# Patient Record
Sex: Male | Born: 1975
Health system: Southern US, Community
[De-identification: ages and names within clinical notes are randomized; demographics above are authoritative.]

## PROBLEM LIST (undated history)

## (undated) DIAGNOSIS — F419 Anxiety disorder, unspecified: Secondary | ICD-10-CM

## (undated) HISTORY — PX: NO PAST SURGERIES: SHX2092

---

## 2013-02-23 ENCOUNTER — Emergency Department
Admission: EM | Admit: 2013-02-23 | Discharge: 2013-02-23 | Disposition: A | Discharge status: Home / Self Care | Payer: BC Managed Care – PPO | Source: Home / Self Care | Attending: Family Medicine | Admitting: Family Medicine

## 2013-02-23 ENCOUNTER — Encounter: Payer: Self-pay | Admitting: *Deleted

## 2013-02-23 DIAGNOSIS — T6391XA Toxic effect of contact with unspecified venomous animal, accidental (unintentional), initial encounter: Secondary | ICD-10-CM

## 2013-02-23 DIAGNOSIS — Z23 Encounter for immunization: Secondary | ICD-10-CM

## 2013-02-23 HISTORY — DX: Anxiety disorder, unspecified: F41.9

## 2013-02-23 MED ORDER — TRIAMCINOLONE ACETONIDE 40 MG/ML IJ SUSP
40.0000 mg | Freq: Once | INTRAMUSCULAR | Status: AC
  Administered 2013-02-23: 40 mg via INTRAMUSCULAR

## 2013-02-23 MED ORDER — TETANUS-DIPHTH-ACELL PERTUSSIS 5-2.5-18.5 LF-MCG/0.5 IM SUSP
0.5000 mL | Freq: Once | INTRAMUSCULAR | Status: AC
  Administered 2013-02-23: 0.5 mL via INTRAMUSCULAR

## 2013-02-23 NOTE — ED Notes (Addendum)
Thomas Moses was stung by a bee @ 1230 today on his right hand. He has pulled the stinger out, has edema and redness to right hand. C/o itching. Ice applied. Denies SOB, throat or neck swelling.

## 2013-02-23 NOTE — ED Provider Notes (Signed)
CSN: [id number]     Arrival date & time 02/23/13  1524 History     First MD Initiated Contact with Patient 02/23/13 1600     Chief Complaint  Patient presents with  . Insect Bite    Sting     HPI Comments: Patient was stung on a finger of his right hand about 3 hours ago with resultant swelling, erythema, and warmth of the dorsum of his right hand.  No difficulty swallowing or shortness of breath.  He does not remember his last Tdap.  Patient is a 37 y.o. male presenting with trauma. The history is provided by the patient.  Trauma Mechanism of injury: yellow jacket sting Injury location: finger of right hand. Incident location: home Time since incident: 3 hours   Current symptoms:      Pain quality: aching      Pain timing: constant      Associated symptoms:            Denies chest pain, difficulty breathing, headache, nausea and vomiting.   Relevant PMH:      Tetanus status: out of date   Past Medical History  Diagnosis Date  . Anxiety    History reviewed. No pertinent past surgical history. Family History  Problem Relation Age of Onset  . Hypertension Father    History  Substance Use Topics  . Smoking status: Never Smoker   . Smokeless tobacco: Never Used  . Alcohol Use: Yes    Review of Systems  Cardiovascular: Negative for chest pain.  Gastrointestinal: Negative for nausea and vomiting.  Neurological: Negative for headaches.  All other systems reviewed and are negative.    Allergies  Review of patient's allergies indicates no known allergies.  Home Medications   Current Outpatient Rx  Name  Route  Sig  Dispense  Refill  . PARoxetine (PAXIL) 10 MG tablet   Oral   Take 10 mg by mouth every morning.          BP 121/79  Pulse 62  Temp(Src) 97.9 F (36.6 C) (Oral)  Resp 14  Ht 5\' 10"  (1.778 m)  Wt 173 lb (78.472 kg)  BMI 24.82 kg/m2  SpO2 100% Physical Exam  Nursing note and vitals reviewed. Constitutional: He is oriented to person, place,  and time. He appears well-developed and well-nourished. No distress.  HENT:  Head: Atraumatic.  Mouth/Throat: Oropharynx is clear and moist.  Eyes: Conjunctivae are normal. Pupils are equal, round, and reactive to light.  Neck: Neck supple.  Cardiovascular: Normal heart sounds.   Pulmonary/Chest: Breath sounds normal.  Musculoskeletal: He exhibits edema. He exhibits no tenderness.       Right hand: He exhibits decreased range of motion, decreased capillary refill and swelling. He exhibits no tenderness, no bony tenderness and no deformity. Normal sensation noted. Decreased strength noted. He exhibits finger abduction.       Hands: There is diffuse swelling, warmth, and mild erythema of the dorsum of the right hand but no tenderness.  Distal neurovascular function is intact.   Lymphadenopathy:    He has no cervical adenopathy.  Neurological: He is alert and oriented to person, place, and time.  Skin: Skin is warm and dry. He is not diaphoretic. There is erythema.    ED Course   Procedures      1. Sting from hornet, wasp, or bee, initial encounter     MDM  Tdap administered.  Kenalog 40mg  IM Continue applying ice pack several times daily until  swelling decreases.  Elevate.  Take antihistamine such as Benadryl, Zyrtec, Claritin etc. Return for signs of infection: persistent redness, warmth, pain, fever, etc.  Lattie Haw, MD 02/23/13 [phone]

## 2013-02-24 ENCOUNTER — Telehealth: Payer: Self-pay | Admitting: *Deleted

## 2013-12-21 ENCOUNTER — Emergency Department (INDEPENDENT_AMBULATORY_CARE_PROVIDER_SITE_OTHER): Payer: BC Managed Care – PPO

## 2013-12-21 ENCOUNTER — Encounter: Payer: Self-pay | Admitting: Emergency Medicine

## 2013-12-21 ENCOUNTER — Emergency Department
Admission: EM | Admit: 2013-12-21 | Discharge: 2013-12-21 | Disposition: A | Discharge status: Home / Self Care | Payer: BC Managed Care – PPO | Source: Home / Self Care | Attending: Emergency Medicine | Admitting: Emergency Medicine

## 2013-12-21 DIAGNOSIS — S93601A Unspecified sprain of right foot, initial encounter: Secondary | ICD-10-CM

## 2013-12-21 DIAGNOSIS — M79609 Pain in unspecified limb: Secondary | ICD-10-CM

## 2013-12-21 DIAGNOSIS — S93609A Unspecified sprain of unspecified foot, initial encounter: Secondary | ICD-10-CM

## 2013-12-21 NOTE — ED Provider Notes (Signed)
CSN: [id number]     Arrival date & time 12/21/13  0815 History   First MD Initiated Contact with Patient 12/21/13 [phone]     Chief Complaint  Patient presents with  . Foot Injury   (Consider location/radiation/quality/duration/timing/severity/associated sxs/prior Treatment) HPI Rt foot injury last night, twisted it while playing with daughter. Pain right lateral foot is sharp, severe, worse with movement. He cannot weight-bear right lower extremity . No paresthesias or weakness. He tried taking Aleve and that helped the pain somewhat Denies prior foot or ankle problems. Past Medical History  Diagnosis Date  . Anxiety    History reviewed. No pertinent past surgical history. Family History  Problem Relation Age of Onset  . Hypertension Father    History  Substance Use Topics  . Smoking status: Never Smoker   . Smokeless tobacco: Never Used  . Alcohol Use: Yes    Review of Systems  All other systems reviewed and are negative.   Allergies  Review of patient's allergies indicates not on file.  Home Medications   Prior to Admission medications   Medication Sig Start Date End Date Taking? Authorizing Provider  naproxen sodium (ANAPROX) 220 MG tablet Take 220 mg by mouth 2 (two) times daily with a meal.   Yes Historical Provider, MD  PARoxetine (PAXIL) 10 MG tablet Take 10 mg by mouth every morning.    Historical Provider, MD   BP 130/81  Pulse 60  Temp(Src) 97.9 F (36.6 C) (Oral)  Ht 5' 10"  (1.778 m)  Wt 180 lb (81.647 kg)  BMI 25.83 kg/m2  SpO2 99% Physical Exam  Nursing note and vitals reviewed. Constitutional: He is oriented to person, place, and time. He appears well-developed and well-nourished. No distress.  HENT:  Head: Normocephalic and atraumatic.  Eyes: Conjunctivae and EOM are normal. Pupils are equal, round, and reactive to light. No scleral icterus.  Neck: Normal range of motion.  Cardiovascular: Normal rate.   Pulmonary/Chest: Effort normal.   Abdominal: He exhibits no distension.  Neurological: He is alert and oriented to person, place, and time.  Skin: Skin is warm.  Psychiatric: He has a normal mood and affect.   In general, he is very uncomfortable from right foot pain and cannot weight-bear on it. Right ankle: Nontender without deformity. Range of motion normal. Right foot: Swollen, ecchymotic, very tender right lateral mid and proximal foot. Nontender over fifth metatarsal. Range of motion limited because of pain. Extensor tendons tested, intact. Neurovascular distally intact .  ED Course  Procedures (including critical care time) Labs Review Labs Reviewed - No data to display  Imaging Review Dg Foot Complete Right  12/21/2013   CLINICAL DATA:  Lateral right foot pain status post injury  EXAM: RIGHT FOOT COMPLETE - 3+ VIEW  COMPARISON:  None.  FINDINGS: The bones of the foot are adequately mineralized. There is no acute fracture nor dislocation. Specific attention to the third through fifth metatarsals reveals no acute abnormality. The soft tissues of the foot are normal.  IMPRESSION: There is no acute bony abnormality of the right foot.   Electronically Signed   By: David  Martinique   On: 12/21/2013 09:11     MDM   1. Sprain of right foot    X-ray right foot negative. No acute bony abnormalities.  Treatment options discussed, as well as risks, benefits, alternatives. Patient voiced understanding and agreement with the following plans:  Rice Cam Walker Crutches Followup with Ortho if no better one week He declined prescription  pain medication, he prefers to use Aleve as that has helped somewhat. Red flags discussed  Jacqulyn Cane, MD 12/21/13 1009

## 2013-12-21 NOTE — ED Notes (Signed)
Rt foot injury last night, twisted it while playing with daughter.

## 2014-05-10 ENCOUNTER — Ambulatory Visit: Payer: BC Managed Care – PPO | Admitting: Family

## 2014-11-18 ENCOUNTER — Encounter: Payer: Self-pay | Admitting: Emergency Medicine

## 2014-11-18 ENCOUNTER — Emergency Department
Admission: EM | Admit: 2014-11-18 | Discharge: 2014-11-18 | Disposition: A | Discharge status: Home / Self Care | Payer: Managed Care, Other (non HMO) | Source: Home / Self Care | Attending: Family Medicine | Admitting: Family Medicine

## 2014-11-18 DIAGNOSIS — F411 Generalized anxiety disorder: Secondary | ICD-10-CM | POA: Diagnosis not present

## 2014-11-18 MED ORDER — LORAZEPAM 0.5 MG PO TABS: ORAL_TABLET | ORAL | Status: DC

## 2014-11-18 NOTE — ED Notes (Signed)
Patient has chronic anxiety issues.

## 2014-11-18 NOTE — ED Provider Notes (Signed)
CSN: [id number]     Arrival date & time 11/18/14  1620 History   First MD Initiated Contact with Patient 11/18/14 1721     Chief Complaint  Patient presents with  . Anxiety      HPI Comments: Patient has a long history of anxiety and panic attacks which had been controlled during the past 7 to 8 years with Paxil.  About 3 months ago his provider switched him to Lexapro 88m daily.  He had a good response initially, but during the past several days he has become more anxious and "panicky."  During the past week he has had early morning awakening.  He does not feel down or depressed.  He has had no adverse effects from Lexapro. He has a follow-up with his PCP in five days.  Patient is a 39y.o. male presenting with anxiety. The history is provided by the patient.  Anxiety This is a chronic problem. Episode onset: worse for 3 to 4 days. The problem has been gradually worsening. Associated symptoms include shortness of breath. Pertinent negatives include no chest pain and no headaches. The symptoms are aggravated by stress. Nothing relieves the symptoms.    Past Medical History  Diagnosis Date  . Anxiety    History reviewed. No pertinent past surgical history. Family History  Problem Relation Age of Onset  . Hypertension Father    History  Substance Use Topics  . Smoking status: Never Smoker   . Smokeless tobacco: Never Used  . Alcohol Use: Yes    Review of Systems  Constitutional: Positive for fatigue.  Respiratory: Positive for shortness of breath.   Cardiovascular: Negative for chest pain.  Neurological: Negative for headaches.  Psychiatric/Behavioral: Positive for sleep disturbance, decreased concentration and agitation. Negative for suicidal ideas. The patient is nervous/anxious.   All other systems reviewed and are negative.   Allergies  Review of patient's allergies indicates not on file.  Home Medications   Prior to Admission medications   Medication Sig Start Date  End Date Taking? Authorizing Provider  LORazepam (ATIVAN) 0.5 MG tablet Take one tab by mouth, one to three times daily as needed for anxiety 11/18/14   SKandra Nicolas MD  naproxen sodium (ANAPROX) 220 MG tablet Take 220 mg by mouth 2 (two) times daily with a meal.    Historical Provider, MD  PARoxetine (PAXIL) 10 MG tablet Take 10 mg by mouth every morning.    Historical Provider, MD   Pulse 70  Temp(Src) 98.4 F (36.9 C) (Oral)  Resp 16  Ht 5' 9.5" (1.765 m)  Wt 167 lb (75.751 kg)  BMI 24.32 kg/m2  SpO2 100% Physical Exam  Constitutional: He is oriented to person, place, and time. He appears well-developed and well-nourished. No distress.  HENT:  Head: Normocephalic.  Eyes: Conjunctivae are normal. Pupils are equal, round, and reactive to light.  Cardiovascular: Normal heart sounds.   Pulmonary/Chest: Breath sounds normal.  Neurological: He is alert and oriented to person, place, and time.  Psychiatric: He has a normal mood and affect. His behavior is normal. Judgment and thought content normal.  Nursing note and vitals reviewed.   ED Course  Procedures  none   MDM   1. Generalized anxiety disorder, decreased control     Increase escitalopram (Lexapro) 138mto TWO tabs once daily at bedtime. Rx for lorazepam 0.87m587m#20, no refill) once daily to TID prn Followup with Family Doctor in about 5 days.    SteKandra NicolasD  11/25/14 1053 

## 2014-11-18 NOTE — Discharge Instructions (Signed)
Increase escitalopram (Lexapro) 69m to TWO tabs once daily at bedtime.

## 2015-09-23 MED FILL — ESCITALOPRAM 20 MG TABLET: 20 | 90 days supply | Qty: 180 | Fill #1

## 2016-01-13 MED FILL — LORazepam 0.5 MG TABS: 0.5 | 15 days supply | Qty: 30 | Fill #0

## 2016-06-02 MED FILL — ESCITALOPRAM 20 MG TABLET: 20 | 30 days supply | Qty: 30 | Fill #0

## 2016-08-04 MED FILL — ESCITALOPRAM 20 MG TABLET: 20 | 30 days supply | Qty: 30 | Fill #0

## 2016-09-15 MED FILL — ESCITALOPRAM 20 MG TABLET: 20 | 30 days supply | Qty: 30 | Fill #1

## 2016-09-26 ENCOUNTER — Ambulatory Visit (INDEPENDENT_AMBULATORY_CARE_PROVIDER_SITE_OTHER): Payer: Self-pay | Admitting: Nurse Practitioner

## 2016-09-26 ENCOUNTER — Encounter: Payer: Self-pay | Admitting: Nurse Practitioner

## 2016-09-26 VITALS — BP 100/80 | HR 58 | Temp 97.5°F | Ht 70.0 in | Wt 178.8 lb

## 2016-09-26 DIAGNOSIS — J302 Other seasonal allergic rhinitis: Secondary | ICD-10-CM

## 2016-09-26 DIAGNOSIS — Z0000 Encounter for general adult medical examination without abnormal findings: Secondary | ICD-10-CM

## 2016-09-26 MED ORDER — MONTELUKAST SODIUM 10 MG PO TABS: 10.0000 mg | ORAL_TABLET | Freq: Every day | ORAL | 3 refills | Status: DC

## 2016-09-26 MED ORDER — FLUTICASONE PROPIONATE 50 MCG/ACT NA SUSP: 2.0000 | Freq: Every day | NASAL | 2 refills | Status: DC

## 2016-09-26 NOTE — Progress Notes (Signed)
Subjective:  Thomas Moses Reason is a 41 y.o. male who presents for basic physical exam for work. Patient denies any current health related concerns.   The patient denies any history of heart, lung, liver, kidney disease, abnormal bleeding disorders, HTN, seizures or DM.   Immunization History  Administered Date(s) Administered  . Tdap 02/23/2013    Past Medical History:  Diagnosis Date  . Anxiety     No past surgical history on file.  Social History  Substance Use Topics  . Smoking status: Never Smoker  . Smokeless tobacco: Never Used  . Alcohol use Yes    No Known Allergies  Current Outpatient Prescriptions  Medication Sig Dispense Refill  . escitalopram (LEXAPRO) 20 MG tablet Take 20 mg by mouth daily.    Marland Kitchen LORazepam (ATIVAN) 0.5 MG tablet Take one tab by mouth, one to three times daily as needed for anxiety (Patient not taking: Reported on 09/26/2016) 20 tablet 0  . naproxen sodium (ANAPROX) 220 MG tablet Take 220 mg by mouth 2 (two) times daily with a meal.    . PARoxetine (PAXIL) 10 MG tablet Take 10 mg by mouth every morning.     No current facility-administered medications for this visit.     Review of Systems  Constitutional: Negative.   HENT: Positive for congestion. Negative for ear discharge, ear pain and sore throat.        + PND  Eyes: Negative.   Respiratory: Cough: productive, clear sputum.   Gastrointestinal: Negative.   Genitourinary: Negative.   Musculoskeletal: Negative.   Skin: Negative.   Neurological: Negative.   Psychiatric/Behavioral: Negative.       Objective:  BP 100/80   Pulse (!) 58   Temp 97.5 F (36.4 C)   Ht 5' 10"  (1.778 m)   Wt 178 lb 12.8 oz (81.1 kg)   SpO2 98%   BMI 25.66 kg/m   General Appearance:  Alert, cooperative, no distress, appears stated age  Head:  Normocephalic, without obvious abnormality, atraumatic  Eyes:  PERRL, conjunctiva/corneas clear, EOM's intact, fundi benign, both eyes  Ears:  Normal TM's and external  ear canals, both ears  Nose: Nares normal, septum midline, mucosa normal, no drainage or sinus tenderness  Throat: Lips, mucosa, and tongue normal; teeth and gums normal  Neck: Supple, symmetrical, trachea midline, no adenopathy, thyroid: not enlarged, symmetric, no tenderness/mass/nodules, no carotid bruit or JVD  Back:   Symmetric, no curvature, ROM normal, no CVA tenderness  Lungs:   Clear to auscultation bilaterally, respirations unlabored  Chest Wall:  No tenderness or deformity  Heart:  Regular rate and rhythm, S1, S2 normal, no murmur, rub or gallop  Abdomen:   Soft, non-tender, bowel sounds active all four quadrants,  no masses, no organomegaly        Extremities: Extremities normal, atraumatic, no cyanosis or edema  Pulses: 2+ and symmetric  Skin: Skin color, texture, turgor normal, no rashes or lesions  Lymph nodes: Cervical, supraclavicular, and axillary nodes normal  Neurologic: Normal        Assessment:  basic physical exam    Plan:  Patient education provided.  No labs needed at this time.  There were no abnormal findings in this annual physical exam. Patient will follow up as needed with PCP or in this clinic.

## 2016-09-26 NOTE — Patient Instructions (Addendum)
Allergic Rhinitis Allergic rhinitis is when the mucous membranes in the nose respond to allergens. Allergens are particles in the air that cause your body to have an allergic reaction. This causes you to release allergic antibodies. Through a chain of events, these eventually cause you to release histamine into the blood stream. Although meant to protect the body, it is this release of histamine that causes your discomfort, such as frequent sneezing, congestion, and an itchy, runny nose. What are the causes? Seasonal allergic rhinitis (hay fever) is caused by pollen allergens that may come from grasses, trees, and weeds. Year-round allergic rhinitis (perennial allergic rhinitis) is caused by allergens such as house dust mites, pet dander, and mold spores. What are the signs or symptoms?  Nasal stuffiness (congestion).  Itchy, runny nose with sneezing and tearing of the eyes. How is this diagnosed? Your health care provider can help you determine the allergen or allergens that trigger your symptoms. If you and your health care provider are unable to determine the allergen, skin or blood testing may be used. Your health care provider will diagnose your condition after taking your health history and performing a physical exam. Your health care provider may assess you for other related conditions, such as asthma, pink eye, or an ear infection. How is this treated? Allergic rhinitis does not have a cure, but it can be controlled by:  Medicines that block allergy symptoms. These may include allergy shots, nasal sprays, and oral antihistamines.  Avoiding the allergen. Hay fever may often be treated with antihistamines in pill or nasal spray forms. Antihistamines block the effects of histamine. There are over-the-counter medicines that may help with nasal congestion and swelling around the eyes. Check with your health care provider before taking or giving this medicine. If avoiding the allergen or the  medicine prescribed do not work, there are many new medicines your health care provider can prescribe. Stronger medicine may be used if initial measures are ineffective. Desensitizing injections can be used if medicine and avoidance does not work. Desensitization is when a patient is given ongoing shots until the body becomes less sensitive to the allergen. Make sure you follow up with your health care provider if problems continue. Follow these instructions at home: It is not possible to completely avoid allergens, but you can reduce your symptoms by taking steps to limit your exposure to them. It helps to know exactly what you are allergic to so that you can avoid your specific triggers. Contact a health care provider if:  You have a fever.  You develop a cough that does not stop easily (persistent).  You have shortness of breath.  You start wheezing.  Symptoms interfere with normal daily activities. This information is not intended to replace advice given to you by your health care provider. Make sure you discuss any questions you have with your health care provider. Document Released: 03/24/2001 Document Revised: 02/28/2016 Document Reviewed: 03/06/2013 Elsevier Interactive Patient Education  2017 [employer].

## 2016-10-09 MED FILL — ESCITALOPRAM 20 MG TABLET: 20 | 30 days supply | Qty: 30 | Fill #2

## 2016-10-09 MED FILL — FLUTICASONE PROP 50 MCG SPR: 50 | 30 days supply | Qty: 16 | Fill #0

## 2016-10-09 MED FILL — MONTELUKAST SOD 10 MG TAB: 10 | 30 days supply | Qty: 30 | Fill #0

## 2016-11-09 MED FILL — MONTELUKAST SOD 10 MG TAB: 10 | 30 days supply | Qty: 30 | Fill #1

## 2016-12-04 MED FILL — MONTELUKAST SOD 10 MG TAB: 10 | 30 days supply | Qty: 30 | Fill #2

## 2016-12-04 MED FILL — ESCITALOPRAM 20 MG TABLET: 20 | 30 days supply | Qty: 30 | Fill #3

## 2017-01-12 MED FILL — ESCITALOPRAM 20 MG TABLET: 20 | 30 days supply | Qty: 30 | Fill #0

## 2017-01-24 MED FILL — MONTELUKAST SOD 10 MG TAB: 10 | 30 days supply | Qty: 30 | Fill #3

## 2017-02-08 MED FILL — ESCITALOPRAM 20 MG TABLET: 20 | 30 days supply | Qty: 30 | Fill #1

## 2017-03-23 ENCOUNTER — Other Ambulatory Visit: Payer: Self-pay | Admitting: Nurse Practitioner

## 2017-03-23 MED FILL — ESCITALOPRAM 20 MG TABLET: 20 | 30 days supply | Qty: 30 | Fill #2

## 2017-04-30 MED FILL — ESCITALOPRAM 20 MG TABLET: 20 | 30 days supply | Qty: 30 | Fill #3

## 2017-06-14 MED FILL — ESCITALOPRAM 20 MG TABLET: 20 | 30 days supply | Qty: 30 | Fill #4

## 2017-06-14 MED FILL — FLUTICASONE PROP 50 MCG SPR: 50 | 30 days supply | Qty: 16 | Fill #1

## 2017-06-17 MED FILL — MONTELUKAST SOD 10 MG TAB: 10 | 30 days supply | Qty: 30 | Fill #0

## 2017-07-13 MED FILL — ESCITALOPRAM 20 MG TABLET: 20 | 30 days supply | Qty: 30 | Fill #5

## 2017-08-30 MED FILL — ESCITALOPRAM 20 MG TABLET: 20 | 90 days supply | Qty: 90 | Fill #0

## 2017-10-26 ENCOUNTER — Ambulatory Visit: Payer: Self-pay | Admitting: Family

## 2017-10-26 VITALS — BP 130/80 | HR 62 | Temp 97.5°F | Resp 16 | Wt 183.4 lb

## 2017-10-26 DIAGNOSIS — M545 Low back pain, unspecified: Secondary | ICD-10-CM

## 2017-10-26 MED ORDER — CYCLOBENZAPRINE HCL 10 MG PO TABS: 10.0000 mg | ORAL_TABLET | Freq: Three times a day (TID) | ORAL | 0 refills | Status: DC | PRN

## 2017-10-26 MED ORDER — MELOXICAM 15 MG PO TABS: 15.0000 mg | ORAL_TABLET | Freq: Every day | ORAL | 0 refills | Status: DC

## 2017-10-26 MED FILL — MELOXICAM 15 MG TABLET: 15 | 30 days supply | Qty: 30 | Fill #0

## 2017-10-26 MED FILL — CYCLOBENZAPRINE 10 MG TAB: 10 | 10 days supply | Qty: 30 | Fill #0

## 2017-10-26 NOTE — Patient Instructions (Signed)
Low Back Sprain Rehab  Ask your health care provider which exercises are safe for you. Do exercises exactly as told by your health care provider and adjust them as directed. It is normal to feel mild stretching, pulling, tightness, or discomfort as you do these exercises, but you should stop right away if you feel sudden pain or your pain gets worse. Do not begin these exercises until told by your health care provider.  Stretching and range of motion exercises  These exercises warm up your muscles and joints and improve the movement and flexibility of your back. These exercises also help to relieve pain, numbness, and tingling.  Exercise A: Lumbar rotation    1. Lie on your back on a firm surface and bend your knees.  2. Straighten your arms out to your sides so each arm forms an "L" shape with a side of your body (a 90 degree angle).  3. Slowly move both of your knees to one side of your body until you feel a stretch in your lower back. Try not to let your shoulders move off of the floor.  4. Hold for __________ seconds.  5. Tense your abdominal muscles and slowly move your knees back to the starting position.  6. Repeat this exercise on the other side of your body.  Repeat __________ times. Complete this exercise __________ times a day.  Exercise B: Prone extension on elbows    1. Lie on your abdomen on a firm surface.  2. Prop yourself up on your elbows.  3. Use your arms to help lift your chest up until you feel a gentle stretch in your abdomen and your lower back.  ? This will place some of your body weight on your elbows. If this is uncomfortable, try stacking pillows under your chest.  ? Your hips should stay down, against the surface that you are lying on. Keep your hip and back muscles relaxed.  4. Hold for __________ seconds.  5. Slowly relax your upper body and return to the starting position.  Repeat __________ times. Complete this exercise __________ times a day.  Strengthening exercises  These  exercises build strength and endurance in your back. Endurance is the ability to use your muscles for a long time, even after they get tired.  Exercise C: Pelvic tilt  1. Lie on your back on a firm surface. Bend your knees and keep your feet flat.  2. Tense your abdominal muscles. Tip your pelvis up toward the ceiling and flatten your lower back into the floor.  ? To help with this exercise, you may place a small towel under your lower back and try to push your back into the towel.  3. Hold for __________ seconds.  4. Let your muscles relax completely before you repeat this exercise.  Repeat __________ times. Complete this exercise __________ times a day.  Exercise D: Alternating arm and leg raises    1. Get on your hands and knees on a firm surface. If you are on a hard floor, you may want to use padding to cushion your knees, such as an exercise mat.  2. Line up your arms and legs. Your hands should be below your shoulders, and your knees should be below your hips.  3. Lift your left leg behind you. At the same time, raise your right arm and straighten it in front of you.  ? Do not lift your leg higher than your hip.  ? Do not lift your arm   higher than your shoulder.  ? Keep your abdominal and back muscles tight.  ? Keep your hips facing the ground.  ? Do not arch your back.  ? Keep your balance carefully, and do not hold your breath.  4. Hold for __________ seconds.  5. Slowly return to the starting position and repeat with your right leg and your left arm.  Repeat __________ times. Complete this exercise __________ times a day.  Exercise E: Abdominal set with straight leg raise    1. Lie on your back on a firm surface.  2. Bend one of your knees and keep your other leg straight.  3. Tense your abdominal muscles and lift your straight leg up, 4-6 inches (10-15 cm) off the ground.  4. Keep your abdominal muscles tight and hold for __________ seconds.  ? Do not hold your breath.  ? Do not arch your back. Keep it  flat against the ground.  5. Keep your abdominal muscles tense as you slowly lower your leg back to the starting position.  6. Repeat with your other leg.  Repeat __________ times. Complete this exercise __________ times a day.  Posture and body mechanics    Body mechanics refers to the movements and positions of your body while you do your daily activities. Posture is part of body mechanics. Good posture and healthy body mechanics can help to relieve stress in your body's tissues and joints. Good posture means that your spine is in its natural S-curve position (your spine is neutral), your shoulders are pulled back slightly, and your head is not tipped forward. The following are general guidelines for applying improved posture and body mechanics to your everyday activities.  Standing    · When standing, keep your spine neutral and your feet about hip-width apart. Keep a slight bend in your knees. Your ears, shoulders, and hips should line up.  · When you do a task in which you stand in one place for a long time, place one foot up on a stable object that is 2-4 inches (5-10 cm) high, such as a footstool. This helps keep your spine neutral.  Sitting    · When sitting, keep your spine neutral and keep your feet flat on the floor. Use a footrest, if necessary, and keep your thighs parallel to the floor. Avoid rounding your shoulders, and avoid tilting your head forward.  · When working at a desk or a computer, keep your desk at a height where your hands are slightly lower than your elbows. Slide your chair under your desk so you are close enough to maintain good posture.  · When working at a computer, place your monitor at a height where you are looking straight ahead and you do not have to tilt your head forward or downward to look at the screen.  Resting    · When lying down and resting, avoid positions that are most painful for you.  · If you have pain with activities such as sitting, bending, stooping, or squatting  (flexion-based activities), lie in a position in which your body does not bend very much. For example, avoid curling up on your side with your arms and knees near your chest (fetal position).  · If you have pain with activities such as standing for a long time or reaching with your arms (extension-based activities), lie with your spine in a neutral position and bend your knees slightly. Try the following positions:  · Lying on your side with a   pillow between your knees.  · Lying on your back with a pillow under your knees.  Lifting    · When lifting objects, keep your feet at least shoulder-width apart and tighten your abdominal muscles.  · Bend your knees and hips and keep your spine neutral. It is important to lift using the strength of your legs, not your back. Do not lock your knees straight out.  · Always ask for help to lift heavy or awkward objects.  This information is not intended to replace advice given to you by your health care provider. Make sure you discuss any questions you have with your health care provider.  Document Released: 06/29/2005 Document Revised: 03/05/2016 Document Reviewed: 04/10/2015  Elsevier Interactive Patient Education © 2018 Elsevier Inc.

## 2017-10-26 NOTE — Progress Notes (Signed)
Subjective:     Patient ID: Thomas Moses, male   DOB: 09/12/1975, 42 y.o.   MRN: [id number]  HPI 42 year old male is in today with c/o low back pain after mowing the grass this past weekend. Pain 6/10, worse with movement. Does not radiate. Never had back pain in the past. No frequency or urgency to urinate.   Review of Systems  Constitutional: Negative.   Respiratory: Negative.   Cardiovascular: Negative.   Endocrine: Negative.   Musculoskeletal: Positive for back pain.  Neurological: Negative.   Psychiatric/Behavioral: Negative.     Past Medical History:  Diagnosis Date  . Anxiety     Social History   Socioeconomic History  . Marital status: Married    Spouse name: Not on file  . Number of children: Not on file  . Years of education: Not on file  . Highest education level: Not on file  Occupational History  . Not on file  Social Needs  . Financial resource strain: Not on file  . Food insecurity:    Worry: Not on file    Inability: Not on file  . Transportation needs:    Medical: Not on file    Non-medical: Not on file  Tobacco Use  . Smoking status: Never Smoker  . Smokeless tobacco: Never Used  Substance and Sexual Activity  . Alcohol use: Yes  . Drug use: No  . Sexual activity: Not on file  Lifestyle  . Physical activity:    Days per week: Not on file    Minutes per session: Not on file  . Stress: Not on file  Relationships  . Social connections:    Talks on phone: Not on file    Gets together: Not on file    Attends religious service: Not on file    Active member of club or organization: Not on file    Attends meetings of clubs or organizations: Not on file    Relationship status: Not on file  . Intimate partner violence:    Fear of current or ex partner: Not on file    Emotionally abused: Not on file    Physically abused: Not on file    Forced sexual activity: Not on file  Other Topics Concern  . Not on file  Social History Narrative  . Not on  file    History reviewed. No pertinent surgical history.  Family History  Problem Relation Age of Onset  . Hypertension Father     No Known Allergies  Current Outpatient Medications on File Prior to Visit  Medication Sig Dispense Refill  . escitalopram (LEXAPRO) 20 MG tablet Take 20 mg by mouth daily.    Marland Kitchen ibuprofen (ADVIL,MOTRIN) 400 MG tablet Take 400 mg by mouth every 6 (six) hours as needed.    Marland Kitchen LORazepam (ATIVAN) 0.5 MG tablet Take one tab by mouth, one to three times daily as needed for anxiety 20 tablet 0  . fluticasone (FLONASE) 50 MCG/ACT nasal spray Place 2 sprays into both nostrils daily. 16 g 2  . montelukast (SINGULAIR) 10 MG tablet Take 1 tablet (10 mg total) by mouth at bedtime. 30 tablet 3  . naproxen sodium (ANAPROX) 220 MG tablet Take 220 mg by mouth 2 (two) times daily with a meal.    . PARoxetine (PAXIL) 10 MG tablet Take 10 mg by mouth every morning.     No current facility-administered medications on file prior to visit.     BP 130/80 (BP Location:  Right Arm, Patient Position: Sitting, Cuff Size: Normal)   Pulse 62   Temp (!) 97.5 F (36.4 C) (Oral)   Resp 16   Wt 183 lb 6.4 oz (83.2 kg)   SpO2 98%   BMI 26.32 kg/m chart    Objective:   Physical Exam  Constitutional: He is oriented to person, place, and time. He appears well-developed and well-nourished.  Neck: Normal range of motion. Neck supple.  Cardiovascular: Normal rate, regular rhythm and normal heart sounds.  Pulmonary/Chest: Effort normal and breath sounds normal.  Musculoskeletal:  Low back pain elicited with rotation at the waist and with extension. Negative SLR. No tenderness to palpation of the spine.   Neurological: He is alert and oriented to person, place, and time. He has normal reflexes.  Skin: Skin is warm and dry.  Psychiatric: He has a normal mood and affect.       Assessment:     [name] was seen today for back pain.  Diagnoses and all orders for this visit:  Acute  bilateral low back pain without sciatica  Other orders -     cyclobenzaprine (FLEXERIL) 10 MG tablet; Take 1 tablet (10 mg total) by mouth 3 (three) times daily as needed for muscle spasms. -     meloxicam (MOBIC) 15 MG tablet; Take 1 tablet (15 mg total) by mouth daily.      Plan:     Call the office if symptoms worsen or persist. Recheck as needed

## 2017-12-20 MED FILL — ESCITALOPRAM 20 MG TABLET: 20 | 90 days supply | Qty: 90 | Fill #1

## 2017-12-22 MED FILL — CHLORHEXIDINE 0.12% RINSE: 0.12 | 15 days supply | Qty: 473 | Fill #0

## 2018-03-22 MED FILL — LORazepam 0.5 MG TABS: 0.5 | 7 days supply | Qty: 15 | Fill #0

## 2018-03-31 MED FILL — ESCITALOPRAM 20 MG TABLET: 20 | 90 days supply | Qty: 90 | Fill #0

## 2018-05-05 MED FILL — SERTRALINE HCL 50 MG TABLET: 50 | 90 days supply | Qty: 90 | Fill #0

## 2018-05-18 MED FILL — LORazepam 0.5 MG TABS: 0.5 | 7 days supply | Qty: 15 | Fill #0

## 2018-05-26 MED FILL — SERTRALINE HCL 100 MG TAB: 100 | 90 days supply | Qty: 90 | Fill #0

## 2018-05-26 MED FILL — clonazePAM 0.5 MG TABS: 0.5 | 30 days supply | Qty: 30 | Fill #0

## 2018-06-24 MED FILL — CITALOPRAM HBR 20 MG TABLET: 20 | 30 days supply | Qty: 30 | Fill #0

## 2018-06-24 MED FILL — clonazePAM 0.5 MG TABS: 0.5 | 30 days supply | Qty: 30 | Fill #0

## 2018-07-18 MED FILL — CITALOPRAM HBR 20 MG TABLET: 20 | 30 days supply | Qty: 30 | Fill #1

## 2018-08-11 ENCOUNTER — Ambulatory Visit: Payer: Managed Care, Other (non HMO) | Admitting: Sports Medicine

## 2018-08-11 ENCOUNTER — Encounter: Payer: Self-pay | Admitting: Sports Medicine

## 2018-08-11 ENCOUNTER — Ambulatory Visit (INDEPENDENT_AMBULATORY_CARE_PROVIDER_SITE_OTHER): Payer: Managed Care, Other (non HMO)

## 2018-08-11 DIAGNOSIS — R202 Paresthesia of skin: Secondary | ICD-10-CM | POA: Diagnosis not present

## 2018-08-11 DIAGNOSIS — M5136 Other intervertebral disc degeneration, lumbar region with discogenic back pain only: Secondary | ICD-10-CM | POA: Insufficient documentation

## 2018-08-11 MED ORDER — PREDNISONE 50 MG PO TABS: ORAL_TABLET | ORAL | 0 refills | Status: DC

## 2018-08-11 MED FILL — predniSONE 50 MG TABS: 50 | 5 days supply | Qty: 5 | Fill #0

## 2018-08-11 NOTE — Progress Notes (Signed)
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  He has started to have a pins and needle sensation on his anterolateral right thigh, not past the knee.  Does not have any back pain, not worse with sitting, flexion, Valsalva.  Worse with running.  No popping or pain at the knee.  Symptoms are moderate, persistent.  No bowel or bladder dysfunction, saddle numbness, constitutional symptoms.  I reviewed the past medical history, family history, social history, surgical history, and allergies today and no changes were needed.  Please see the problem list section below in epic for further details.  Past Medical History: Past Medical History:  Diagnosis Date  . Anxiety    Past Surgical History: Past Surgical History:  Procedure Laterality Date  . NO PAST SURGERIES     Social History: Social History   Socioeconomic History  . Marital status: Married    Spouse name: Not on file  . Number of children: Not on file  . Years of education: Not on file  . Highest education level: Not on file  Occupational History  . Not on file  Social Needs  . Financial resource strain: Not on file  . Food insecurity:    Worry: Not on file    Inability: Not on file  . Transportation needs:    Medical: Not on file    Non-medical: Not on file  Tobacco Use  . Smoking status: Never Smoker  . Smokeless tobacco: Never Used  Substance and Sexual Activity  . Alcohol use: Yes  . Drug use: No  . Sexual activity: Yes  Lifestyle  . Physical activity:    Days per week: Not on file    Minutes per session: Not on file  . Stress: Not on file  Relationships  . Social connections:    Talks on phone: Not on file    Gets together: Not on file    Attends religious service: Not on file    Active member of club or organization: Not on file    Attends meetings of clubs or organizations: Not on file    Relationship status: Not on file  Other Topics Concern  . Not on file  Social History Narrative  . Not on file   Family History: Family History  Problem Relation Age of Onset  . Hypertension Father    Allergies: No Known Allergies Medications: See med rec.  Review of Systems: No headache, visual changes, nausea, vomiting, diarrhea, constipation, dizziness, abdominal pain, skin rash, fevers, chills, night sweats, swollen lymph nodes, weight loss, chest pain, body aches, joint swelling, muscle aches, shortness of breath, mood changes, visual or auditory hallucinations.  Objective:    General: Well Developed, well nourished, and in no acute distress.  Neuro: Alert and oriented x3, extra-ocular muscles intact, sensation grossly intact.  HEENT: Normocephalic, atraumatic, pupils equal round reactive to light, neck supple, no masses, no lymphadenopathy, thyroid nonpalpable.  Skin: Warm and dry, no rashes noted.  Cardiac: Regular rate and rhythm, no murmurs rubs or gallops.  Respiratory: Clear to auscultation bilaterally. Not using accessory muscles, speaking in full sentences.  Abdominal: Soft, nontender, nondistended, positive bowel sounds, no masses, no organomegaly.  Back Exam:  Inspection: Unremarkable  Motion: Flexion 45 deg, Extension 45 deg, Side Bending to 45 deg bilaterally,  Rotation to 45 deg bilaterally  SLR laying: Negative  XSLR laying: Negative  Palpable tenderness: None. FABER: negative. Sensory change: Gross sensation intact to all lumbar and sacral dermatomes.  Reflexes: 2+ at both patellar tendons, 2+ at achilles tendons, Babinski's downgoing.  Strength at foot  Plantar-flexion: 5/5 Dorsi-flexion: 5/5 Eversion: 5/5 Inversion: 5/5  Leg strength  Quad: 5/5 Hamstring: 5/5 Hip flexor: 5/5 Hip abductors: 5/5  Gait unremarkable. Right Hip: ROM IR: 60 Deg, ER: 60 Deg, Flexion: 120 Deg, Extension: 100 Deg, Abduction: 45 Deg, Adduction: 45 Deg Strength IR: 5/5, ER:  5/5, Flexion: 5/5, Extension: 5/5, Abduction: 5/5, Adduction: 5/5 Pelvic alignment unremarkable to inspection and palpation. Standing hip rotation and gait without trendelenburg / unsteadiness. Greater trochanter without tenderness to palpation. No tenderness over piriformis. No SI joint tenderness and normal minimal SI movement. Negative lateral femoral cutaneous nerve Tinel sign at the ASIS.  Impression and Recommendations:    The patient was counselled, risk factors were discussed, anticipatory guidance given.  Paresthesia of right thigh Paresthesias of the anterolateral right thigh but not past the knee. No back pain. 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 He did have a negative lateral femoral cutaneous nerve Tinel sign at the ASIS. We will start with prednisone, and he will wear loose fitting clothing over the next month. I would like x-rays of his lumbar spine, and we are going to give him some back stretches. Return to see me a month. ___________________________________________ Gwen Her. Dianah Field, M.D., ABFM., CAQSM. Primary Care and Sports Medicine [facility]  Adjunct Professor of [employer]

## 2018-08-11 NOTE — Assessment & Plan Note (Signed)
Paresthesias of the anterolateral right thigh but not past the knee. No back pain. 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 He did have a negative lateral femoral cutaneous nerve Tinel sign at the ASIS. We will start with prednisone, and he will wear loose fitting clothing over the next month. I would like x-rays of his lumbar spine, and we are going to give him some back stretches. Return to see me a month.

## 2018-08-11 NOTE — Patient Instructions (Signed)
Lateral Femoral Cutaneous Nerve Block Patient Information  Description: The lateral femoral cutaneous nerve of the thigh is a purely sensory nerve that can become entrapped or irritated for a number of reasons.  The pain associated with this condition is called meralgia paraesthetica.  Patients affected with this syndrome have burning pain or abnormal sensation along the lateral aspect of the thigh.  The pain can be worsened by prolonged walking, standing, or constrictive garments around the house.   The diagnosis can be confirmed and treatment initiated by blocking the nerve with local anesthetic (like Novocaine).  At times, a steroid solution may be injected at the same time.  The site of injection is through a tiny needle in the left, lower quadrant of the abdomen.   The entire block usually lasts less than 5 minutes.  Conditions which may be treated by lateral femoral cutaneous nerve block:   Meralagia paraesthetica  Preparation for the injection:  1. Do not eat any solid food or dairy products within 8 hours of your appointment.  2. You may drink clear liquids up to 3 hours before appointment.  Clear liquids include water, black coffee, juice or soda. No milk or cream please. 3. You may take your regular medication, including pain medications, with a sip of water before your appointment.  Diabetics should hold regular insulin (if taken separately) and take 1/2 normal NPH dose the morning of the procedure.  Carry some sugar containing items with you to your appointment. 4. A driver must accompany you and be prepared to drive you home after your procedure. 5. Bring all you current medications with you 6. An IV may be inserted and sedation may be given at the discretion of the physician. 7. A blood pressure cuff, EKG and other monitors will often be applied during the procedure.  Some patients may need to have extra oxygen administered for a short period. 8. You will be asked to provide medical  information, including your allergies and medications, prior to the procedure.  We must know immediately if you are taking blood thinners (like Coumadin/Warfarin) or if you allergic to IV iodine contrast (dye)  We must know if you could possible be pregnant.   Possible side-effects:   Bleeding from needle site  Infection (rate, may require surgery)  Nerve injury (rare)  Numbness and Tingling (temporary)  Light-headedness (temporary)  Pain at injection site (several day)  Decreased blood pressure (rare, temporary)  Weakness in leg (temporary)  Call if you experience:  Hives or difficulty breathing (go to the emergency room)  Inflammation or drainage at the injection site(s)  Please note:  Although the local anesthetic injected can often make your leg feel good for several hours after the injection,  The pain may return.  It takes 3-7 days for steroids to work.  You may not notice any pain relief for at least one week.  If effective, we will often do a series of injections spaced 3-6 weeks apart to maximally decrease your pain.  If you have any questions, please cll [phone] [facility]

## 2018-08-12 MED FILL — clonazePAM 0.5 MG TABS: 0.5 | 30 days supply | Qty: 30 | Fill #0

## 2018-08-12 MED FILL — CITALOPRAM HBR 20 MG TABLET: 20 | 90 days supply | Qty: 90 | Fill #0

## 2018-09-09 ENCOUNTER — Ambulatory Visit: Payer: Managed Care, Other (non HMO) | Admitting: Sports Medicine

## 2018-10-31 MED FILL — CITALOPRAM HBR 20 MG TABLET: 20 | 90 days supply | Qty: 90 | Fill #1

## 2019-02-08 MED FILL — CITALOPRAM HBR 20 MG TABLET: 20 | 90 days supply | Qty: 90 | Fill #0

## 2019-02-08 MED FILL — clonazePAM 0.5 MG TABS: 0.5 | 30 days supply | Qty: 30 | Fill #0

## 2019-05-26 MED FILL — CITALOPRAM HBR 20 MG TABLET: 20 | 90 days supply | Qty: 90 | Fill #1

## 2019-06-19 ENCOUNTER — Ambulatory Visit: Payer: Managed Care, Other (non HMO) | Admitting: Sports Medicine

## 2019-08-20 ENCOUNTER — Ambulatory Visit: Payer: Managed Care, Other (non HMO) | Attending: Internal Medicine

## 2019-08-20 DIAGNOSIS — Z23 Encounter for immunization: Secondary | ICD-10-CM | POA: Insufficient documentation

## 2019-08-20 NOTE — Progress Notes (Signed)
   Covid-19 Vaccination Clinic  Name:  Thomas Moses    MRN: [id number] DOB: [birth year]  08/20/2019  Mr. Thomas Moses was observed post Covid-19 immunization for 15 minutes without incidence. He was provided with Vaccine Information Sheet and instruction to access the V-Safe system.   Mr. Thomas Moses was instructed to call 911 with any severe reactions post vaccine: Marland Kitchen Difficulty breathing  . Swelling of your face and throat  . A fast heartbeat  . A bad rash all over your body  . Dizziness and weakness    Immunizations Administered    Name Date Dose VIS Date Route   Pfizer COVID-19 Vaccine 08/20/2019 12:21 PM 0.3 mL 06/23/2019 Intramuscular   Manufacturer: [place]   Lot: [id number]   [place]: [id number]

## 2019-08-29 MED FILL — CITALOPRAM HBR 20 MG TABLET: 20 | 90 days supply | Qty: 90 | Fill #0

## 2019-09-13 ENCOUNTER — Ambulatory Visit: Payer: Managed Care, Other (non HMO) | Attending: Internal Medicine

## 2019-09-13 ENCOUNTER — Ambulatory Visit: Payer: Managed Care, Other (non HMO)

## 2019-09-13 DIAGNOSIS — Z23 Encounter for immunization: Secondary | ICD-10-CM

## 2019-09-13 NOTE — Progress Notes (Signed)
   Covid-19 Vaccination Clinic  Name:  Thomas Moses    MRN: [id number] DOB: [birth year]  09/13/2019  [name] was observed post Covid-19 immunization for 15 minutes without incident. He was provided with Vaccine Information Sheet and instruction to access the V-Safe system.   [name] was instructed to call 911 with any severe reactions post vaccine: Marland Kitchen Difficulty breathing  . Swelling of face and throat  . A fast heartbeat  . A bad rash all over body  . Dizziness and weakness   Immunizations Administered    Name Date Dose VIS Date Route   Pfizer COVID-19 Vaccine 09/13/2019  2:20 PM 0.3 mL 06/23/2019 Intramuscular   Manufacturer: [place]   Lot: [id number]   [place]: [id number]

## 2020-02-05 ENCOUNTER — Ambulatory Visit (INDEPENDENT_AMBULATORY_CARE_PROVIDER_SITE_OTHER): Payer: Managed Care, Other (non HMO) | Admitting: Sports Medicine

## 2020-02-05 ENCOUNTER — Other Ambulatory Visit: Payer: Self-pay

## 2020-02-05 ENCOUNTER — Encounter: Payer: Self-pay | Admitting: Sports Medicine

## 2020-02-05 ENCOUNTER — Ambulatory Visit (INDEPENDENT_AMBULATORY_CARE_PROVIDER_SITE_OTHER): Payer: Managed Care, Other (non HMO)

## 2020-02-05 DIAGNOSIS — M5136 Other intervertebral disc degeneration, lumbar region with discogenic back pain only: Secondary | ICD-10-CM

## 2020-02-05 DIAGNOSIS — M19071 Primary osteoarthritis, right ankle and foot: Secondary | ICD-10-CM | POA: Diagnosis not present

## 2020-02-05 MED ORDER — MELOXICAM 15 MG PO TABS: ORAL_TABLET | ORAL | 3 refills | Status: AC

## 2020-02-05 MED ORDER — PREDNISONE 50 MG PO TABS: ORAL_TABLET | ORAL | 0 refills | Status: AC

## 2020-02-05 NOTE — Progress Notes (Signed)
° ° °  Procedures performed today:    None.  Independent interpretation of notes and tests performed by another provider:   I reviewed his lumbar spine x-rays from a year ago, he has mild L4-L5 DDD.  Brief History, Exam, Impression, and Recommendations:    Discogenic low back pain This is a pleasant 44 year old male, he felt severe pain in his low back, radiation to the buttock and thighs but not past the knee. Pain is worse with sitting, flexion, Valsalva. No bowel or bladder dysfunction, saddle numbness, constitutional symptoms. We will start conservatively with meloxicam following prednisone, formal physical therapy. Return to see me in 6 weeks, MRI for interventional planning if no better.   Osteoarthritis of first metatarsophalangeal (MTP) joint of right foot Pain at the first MTP on the right, adding meloxicam, updated x-rays, he will also get a Morton's plate from [employer]. We can inject this if no better after a month or so.    ___________________________________________ Gwen Her. Dianah Field, M.D., ABFM., CAQSM. Primary Care and [place] Instructor of [employer]

## 2020-02-05 NOTE — Assessment & Plan Note (Signed)
Pain at the first MTP on the right, adding meloxicam, updated x-rays, he will also get a Morton's plate from [employer]. We can inject this if no better after a month or so.

## 2020-02-05 NOTE — Assessment & Plan Note (Signed)
This is a pleasant 44 year old male, he felt severe pain in his low back, radiation to the buttock and thighs but not past the knee. Pain is worse with sitting, flexion, Valsalva. No bowel or bladder dysfunction, saddle numbness, constitutional symptoms. We will start conservatively with meloxicam following prednisone, formal physical therapy. Return to see me in 6 weeks, MRI for interventional planning if no better.

## 2020-02-13 ENCOUNTER — Encounter: Payer: Self-pay | Admitting: Physical Therapy

## 2020-02-13 ENCOUNTER — Other Ambulatory Visit: Payer: Self-pay

## 2020-02-13 ENCOUNTER — Ambulatory Visit: Payer: Managed Care, Other (non HMO) | Attending: Sports Medicine | Admitting: Physical Therapy

## 2020-02-13 DIAGNOSIS — R293 Abnormal posture: Secondary | ICD-10-CM | POA: Diagnosis present

## 2020-02-13 DIAGNOSIS — G8929 Other chronic pain: Secondary | ICD-10-CM | POA: Diagnosis present

## 2020-02-13 DIAGNOSIS — M545 Low back pain: Secondary | ICD-10-CM | POA: Diagnosis not present

## 2020-02-13 NOTE — Therapy (Addendum)
[facility] [address], [place], [place], [address] Phone: [phone]   Fax:  [phone]  Physical Therapy Treatment  Patient Details  Name: Thomas Moses MRN: [id number] Date of Birth: 04/26/1976 Referring Provider (PT): Silverio Decamp, MD   Encounter Date: 02/13/2020   PT End of Session - 02/13/20 1319    Visit Number 1    Date for PT Re-Evaluation 04/09/20    Authorization Type [employer]    PT Start Time 1230    PT Stop Time 2025    PT Time Calculation (min) 47 min    Activity Tolerance Patient tolerated treatment well    Behavior During Therapy [facility] for tasks assessed/performed           Past Medical History:  Diagnosis Date  . Anxiety     Past Surgical History:  Procedure Laterality Date  . NO PAST SURGERIES      There were no vitals filed for this visit.   Subjective Assessment - 02/13/20 1230    Subjective Pt referred to OPPT with acute onset of LBP which is recurring.  3 episodes of LBP over past year.  Tends to occur with bending.  Meds have helped, pain is down to twinges.    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 not limited now that feeling better    How long can you stand comfortably? no    How long can you walk comfortably? no    Diagnostic tests lumbar x-rays: mild L4/5 DDD    Patient Stated Goals loosen up my back, loosen hamstrings    Currently in Pain? Yes    Pain Score 1     Pain Location Back    Pain Orientation Mid;Lower    Pain Descriptors / Indicators Aching;Tightness    Pain Type Chronic pain    Pain Onset 1 to 4 weeks ago    Pain Frequency Intermittent    Aggravating Factors  when flared up, sitting, bending, standing up straight (stuck bent)    Pain Relieving Factors meds              [facility] PT Assessment - 02/13/20 0001      Assessment   Medical Diagnosis M51.36 (ICD-10-CM) - Discogenic low back pain    Referring Provider (PT)  Silverio Decamp, MD    Onset Date/Surgical Date --   approx 1.5 weeks ago   Next MD Visit --   4-6 weeks   Prior Therapy no      Precautions   Precautions None      Restrictions   Weight Bearing Restrictions No      Balance Screen   Has the patient fallen in the past 6 months No      [place] residence    Living Arrangements Spouse/significant other;Children    Type of [place]      Prior Function   Level of Independence Independent    Vocation Full time employment    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    Leisure running, hiking      Cognition   Overall Cognitive Status Within Functional Limits for tasks assessed      Observation/Other Assessments   Observations no atrophy present in bil lumbar multifidi    Focus on Therapeutic Outcomes (FOTO)  28%   goal 15%     Functional Tests   Functional tests Squat;Single leg stance  Squat   Comments symmetry present      Single Leg Stance   Comments good balance, good glut med activation      Posture/Postural Control   Posture/Postural Control Postural limitations    Postural Limitations Increased thoracic kyphosis;Decreased lumbar lordosis      ROM / Strength   AROM / PROM / Strength AROM;PROM;Strength      AROM   Overall AROM Comments trunk ROM painfree with end range limitations for bil SB and Rot      PROM   Overall PROM Comments bil hip ER limited 15 deg, IR limited 5 deg      Strength   Overall Strength Comments grossly 5/5 bil LEs      Flexibility   Soft Tissue Assessment /Muscle Length yes   hip flexors limited 20% bil   Hamstrings limited 35%    Quadriceps limited 20%    Piriformis limited 10%      Palpation   Spinal mobility poor ribcage springing bil, poor PAs thoracic throughout    SI assessment  WNL    Palpation comment non-tender throughout lumbar region but poor skin rolling, myofascial restrictions bil ribcage and thoracodorsal fascia       Special Tests    Special Tests Lumbar    Lumbar Tests Straight Leg Raise      Straight Leg Raise   Findings Negative                                 PT Education - 02/13/20 1318    Education Details foam roller vertically with UE movements, foam roller for t-spine ext, Access Code: [email or web address]    Person(s) Educated Patient    Methods Explanation;Demonstration;Verbal cues;Handout    Comprehension Verbalized understanding;Returned demonstration            PT Short Term Goals - 02/13/20 1320      PT SHORT TERM GOAL #1   Title Pt will be ind with initial HEP for spine ROM and stretching.    Time 4    Period Weeks    Status New    Target Date 03/12/20      PT SHORT TERM GOAL #2   Title Pt will demo LE flexibility to within 20% of normal limits throughout bil LEs    Time 4    Period Weeks    Status New    Target Date 03/12/20      PT SHORT TERM GOAL #3   Title Pt will report no greater than 1/10 LBP with daily demands.    Time 4    Period Weeks    Status New    Target Date 03/12/20             PT Long Term Goals - 02/13/20 1322      PT LONG TERM GOAL #1   Title Pt will be ind with advanced HEP for flexibility, ROM and postural strength.    Time 8    Period Weeks    Status New    Target Date 04/09/20      PT LONG TERM GOAL #2   Title Reduce FOTO to </= 15%    Baseline 28%    Time 8    Period Weeks    Status New    Target Date 04/09/20      PT LONG TERM GOAL #3   Title Pt will demo flexibililty throughout LEs to  WNL without report of pain in low back with end range stretching.    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    Time 8    Period Weeks    Status New    Target Date 04/09/20                 Plan - 02/13/20 1324    Clinical Impression  Statement Pt is a healthy 44yo male with recent acute onset of midline LBP approx 1.5 weeks ago.  This is third LBP episode in the past year.  Pt is much improved following steroid pack and pain is now 1/10.  Pt presents with spinal stiffness of t-spine and lumbar spine, fascial restrictions in lumbar and thoracic regions, and signif limitations of LE flexibility bil.  Pt is a past runner.  Pt was not limited by pain throughout session.  PT initiated HEP for LE stretching and spine ROM/stretching today.  PT also had Pt try foam roller for spine self-mobilization to improve thoracic extension which he really liked.  Pt will benefit from skilled PT to address restrictions and improve overall mobility to reduce undue strain on low back and reduce likelihood or acute LBP recurrence.    Personal Factors and Comorbidities Comorbidity 1    Comorbidities recurring LBP, Rt great toe OA MTP    Examination-Activity Limitations Sit   when flared up   Examination-Participation Restrictions Community Activity   when flared up   Stability/Clinical Decision Making Stable/Uncomplicated    Clinical Decision Making Low    Rehab Potential Excellent    PT Frequency 2x / week    PT Duration 8 weeks    PT Treatment/Interventions Functional mobility training;Therapeutic exercise;Manual techniques;Neuromuscular re-education;Patient/family education;Passive range of motion;Dry needling    PT Next Visit Plan f/u on HEP from eval, body mechanics for bending/lifting, thoracic mobs and myofascial skin rolling and release lumbar and thoracic region    PT Home Exercise Plan Access Code: [email or web address] + foam roller demo for vertical lying along spine and horiz for t-spine ext - Pt may buy one    Consulted and Agree with Plan of Care Patient           Patient will benefit from skilled therapeutic intervention in order to improve the following deficits and impairments:  Decreased range of motion, Increased fascial restricitons, Pain,  Hypomobility, Impaired flexibility, Improper body mechanics, Postural dysfunction  Visit Diagnosis: Chronic midline low back pain without sciatica - Plan: PT plan of care cert/re-cert  Abnormal posture - Plan: PT plan of care cert/re-cert     Problem List Patient Active Problem List   Diagnosis Date Noted  . Osteoarthritis of first metatarsophalangeal (MTP) joint of right foot 02/05/2020  . Discogenic low back pain 08/11/2018    Kemoni Quesenberry, PT 02/13/20 1:57 PM  PHYSICAL THERAPY DISCHARGE SUMMARY  Visits from Start of Care: 1  Current functional level related to goals / functional outcomes: Pt called to cancel follow up visits due to feeling better since eval.   Remaining deficits: See above   Education / Equipment: HEP given at eval. Plan: Patient agrees to discharge.  Patient goals were met. Patient is being discharged due to being pleased with the current functional level.  ?????  Venetia Night Anupama Piehl, PT 04/08/20 12:51 PM   [facility] [address] [place], [place], [address] Phone: [phone]   Fax:  [phone]  Name: Seddrick Flax MRN: [id number] Date of Birth: Dec 03, 1975

## 2020-02-13 NOTE — Patient Instructions (Signed)
Access Code: [email or web address] URL: [email or web address]: 08/03/2021Prepared by: Venetia Night BeuhringExercises  Sidelying Thoracic Rotation with Open Book - 1 x daily - 7 x weekly - 1 sets - 10 reps  Supine Figure 4 Piriformis Stretch - 1 x daily - 7 x weekly - 1 sets - 2 reps - 30 hold  Supine Piriformis Stretch with Foot on Ground - 1 x daily - 7 x weekly - 1 sets - 2 reps - 30 hold  Standing Quadriceps Stretch - 1 x daily - 7 x weekly - 1 sets - 2 reps - 30 hold  Standing Hip Flexor Stretch - 1 x daily - 7 x weekly - 1 sets - 2 reps - 30 hold  Seated Hamstring Stretch - 1 x daily - 7 x weekly - 1 sets - 2 reps - 30 hold  Cat-Camel - 1 x daily - 7 x weekly - 1 sets - 10 reps - 2 hold  Cat-Camel to Child's Pose - 1 x daily - 7 x weekly - 1 sets - 3 reps - 20 hold

## 2020-03-05 ENCOUNTER — Ambulatory Visit: Payer: Managed Care, Other (non HMO) | Admitting: Sports Medicine

## 2020-03-26 ENCOUNTER — Ambulatory Visit: Payer: Managed Care, Other (non HMO) | Admitting: Physical Therapy

## 2020-04-04 ENCOUNTER — Encounter: Payer: Managed Care, Other (non HMO) | Admitting: Physical Therapy

## 2020-04-09 ENCOUNTER — Encounter: Payer: Managed Care, Other (non HMO) | Admitting: Physical Therapy

## 2020-05-11 ENCOUNTER — Ambulatory Visit: Payer: Managed Care, Other (non HMO)

## 2020-05-27 IMAGING — DX DG LUMBAR SPINE COMPLETE 4+V
5 series · 5 of 5 positions shown · non-contrast
Comparison: None.

CLINICAL DATA: 43-year-old male with a history of right thigh pain

EXAM:
LUMBAR SPINE - COMPLETE 4+ VIEW

[l-spine ap]
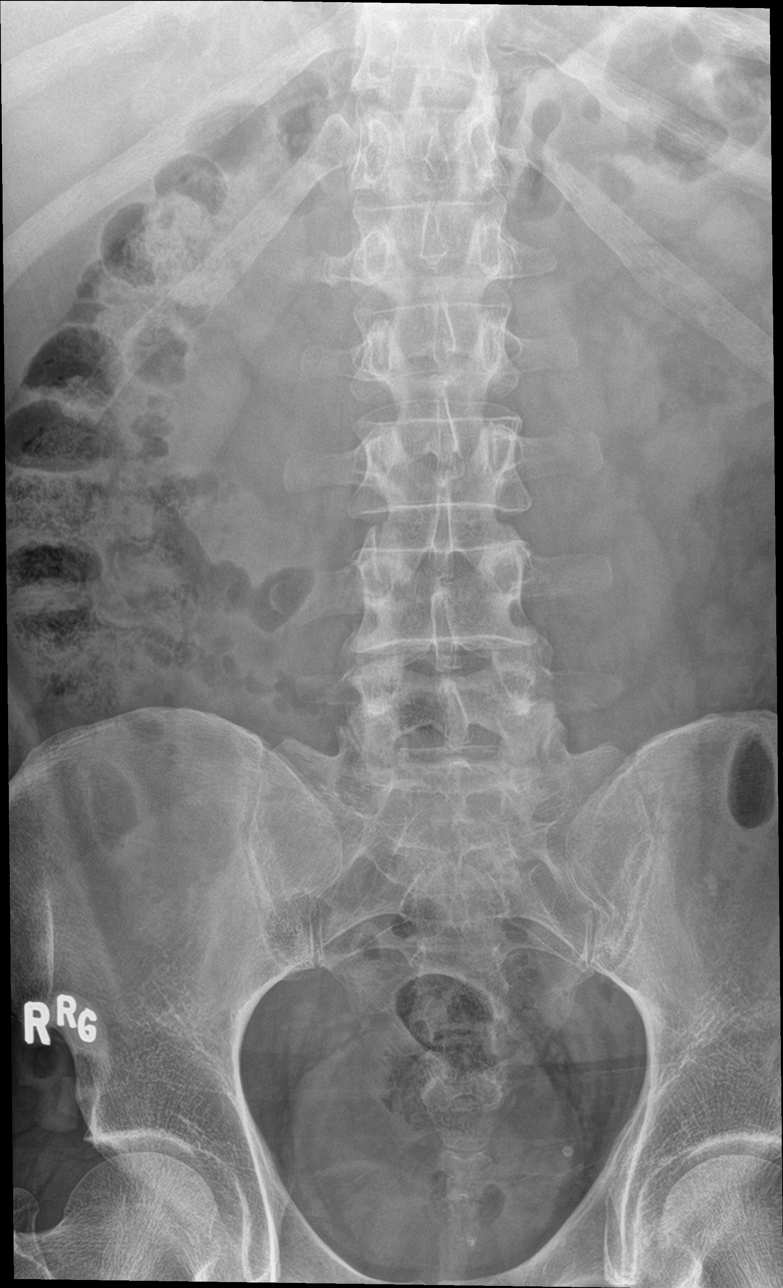

[l-spine obl (1 of 2)]
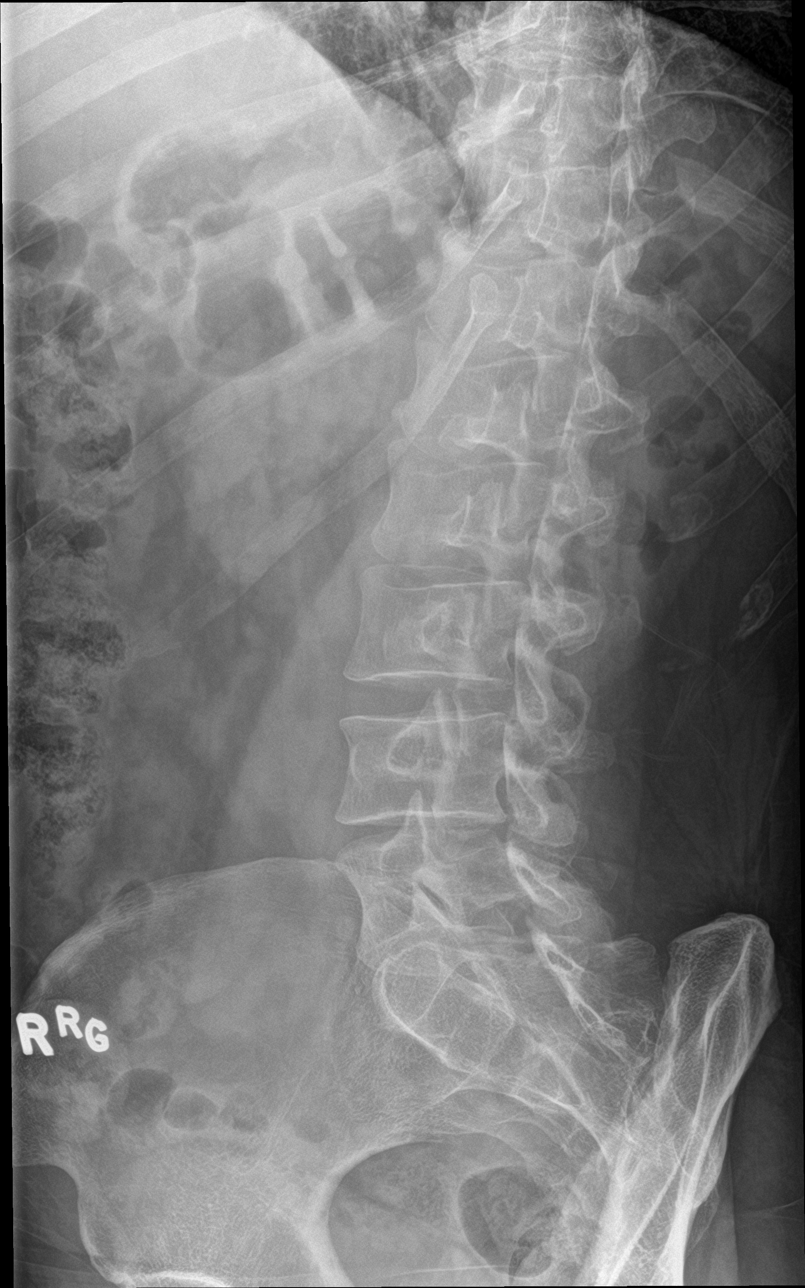

[l-spine obl (2 of 2)]
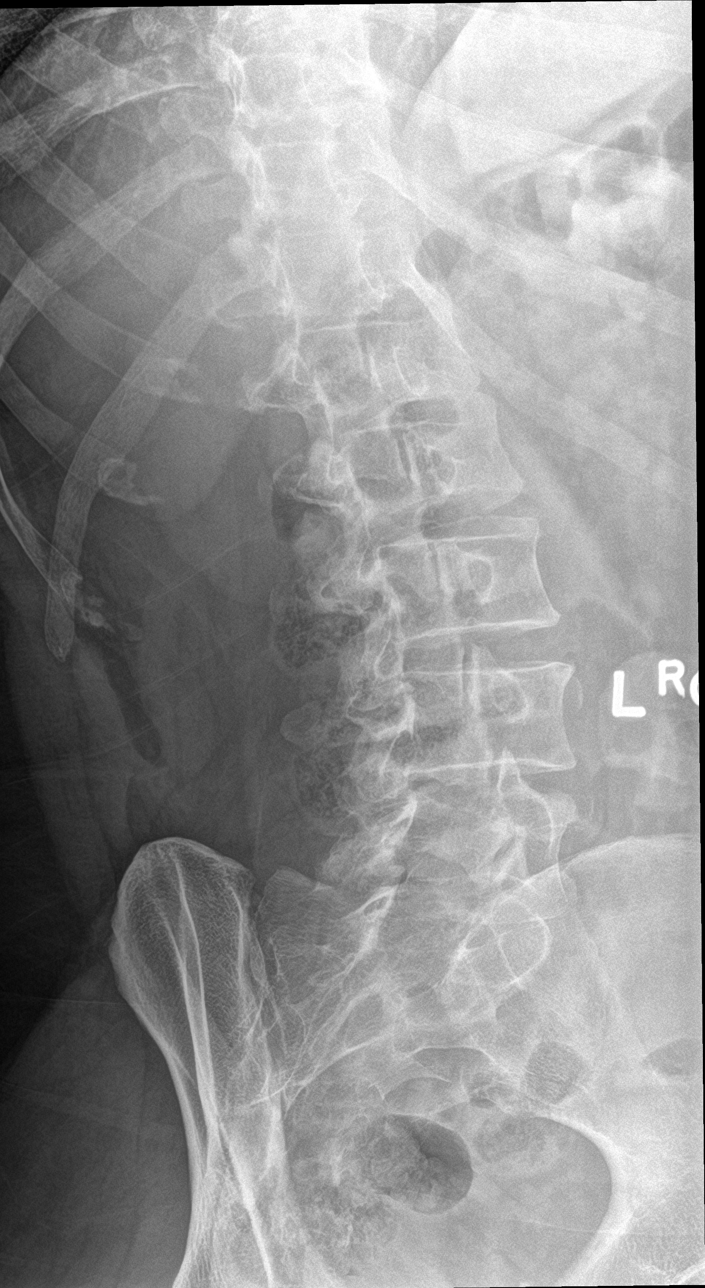

[l-spine lat]
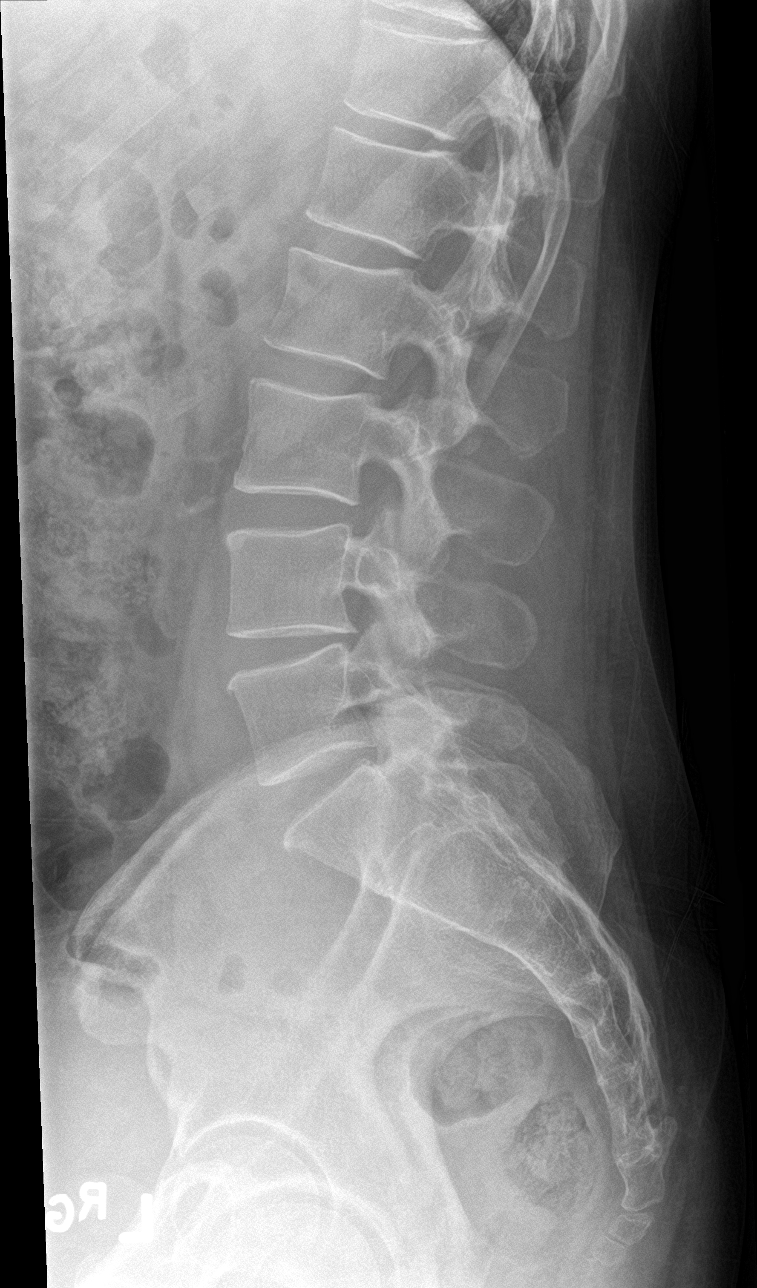

[l-spine spot]
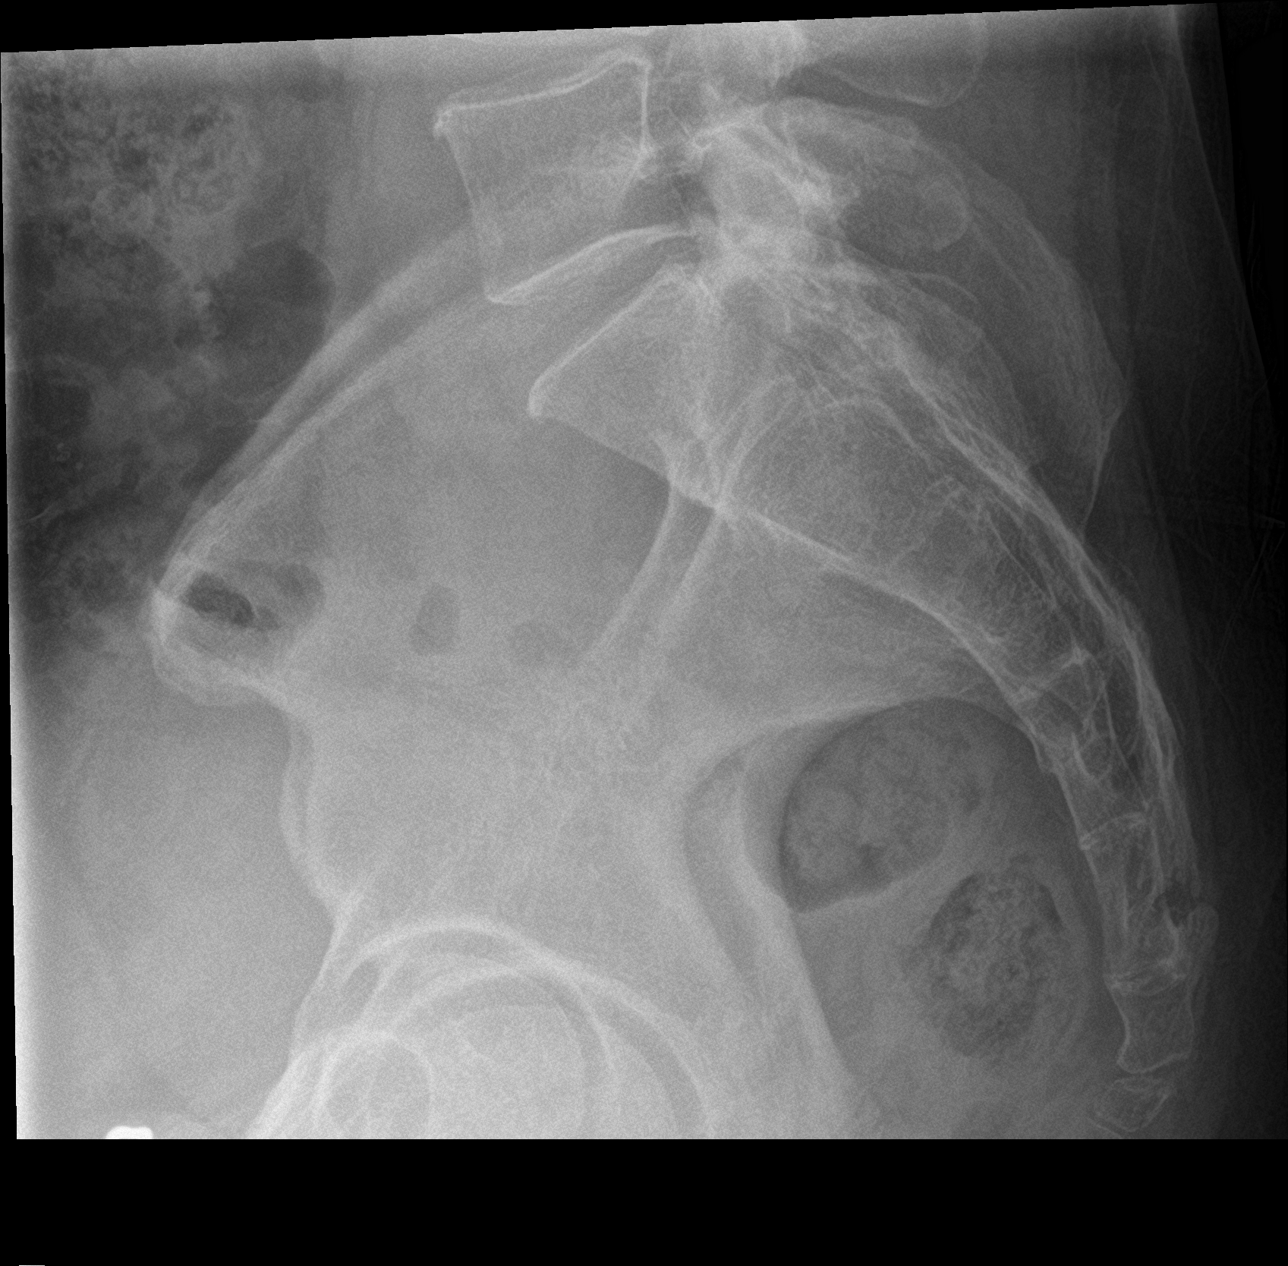

[5 of 5 positions shown; findings below may reference images not displayed]

FINDINGS: Lumbar Spine:

Lumbar vertebral elements maintain normal alignment without evidence
of anterolisthesis, retrolisthesis, subluxation.

No acute fracture line identified.

Vertebral body heights maintained.

Disc space heights maintained with no significant degenerative disc
disease or endplate changes.

Oblique images demonstrate no displaced pars defect.

Early facet hypertrophy most pronounced at L5-S1.

Unremarkable appearance of the visualized abdomen.
IMPRESSION: Negative for acute fracture or malalignment of the lumbar spine.

No significant disc disease, with early facet changes of L5-S1.

## 2020-08-13 ENCOUNTER — Other Ambulatory Visit (HOSPITAL_COMMUNITY): Payer: Self-pay | Admitting: Family Medicine

## 2020-08-13 DIAGNOSIS — M79662 Pain in left lower leg: Secondary | ICD-10-CM

## 2020-08-14 ENCOUNTER — Ambulatory Visit (HOSPITAL_COMMUNITY)
Admission: RE | Admit: 2020-08-14 | Discharge: 2020-08-14 | Disposition: A | Discharge status: Home / Self Care | Payer: Managed Care, Other (non HMO) | Source: Ambulatory Visit | Attending: Family Medicine | Admitting: Family Medicine

## 2020-08-14 ENCOUNTER — Other Ambulatory Visit: Payer: Self-pay

## 2020-08-14 DIAGNOSIS — M7989 Other specified soft tissue disorders: Secondary | ICD-10-CM | POA: Diagnosis not present

## 2020-08-14 DIAGNOSIS — M79662 Pain in left lower leg: Secondary | ICD-10-CM | POA: Diagnosis present

## 2020-08-14 NOTE — Progress Notes (Signed)
Left lower extremity venous study completed.    Spoke with Jillyn Ledger   Please see CV Proc for preliminary results.   Vonzell Schlatter, RVT

## 2021-01-24 ENCOUNTER — Other Ambulatory Visit (HOSPITAL_BASED_OUTPATIENT_CLINIC_OR_DEPARTMENT_OTHER): Payer: Self-pay

## 2021-02-18 ENCOUNTER — Other Ambulatory Visit (HOSPITAL_BASED_OUTPATIENT_CLINIC_OR_DEPARTMENT_OTHER): Payer: Self-pay

## 2021-02-25 ENCOUNTER — Other Ambulatory Visit (HOSPITAL_BASED_OUTPATIENT_CLINIC_OR_DEPARTMENT_OTHER): Payer: Self-pay

## 2021-02-25 MED ORDER — CITALOPRAM HYDROBROMIDE 20 MG PO TABS
ORAL_TABLET | ORAL | 3 refills | Status: AC
  Filled 2021-02-25: qty 30, 30d supply, fill #0
  Filled 2021-04-14: qty 30, 30d supply, fill #1
  Filled 2021-05-15: qty 30, 30d supply, fill #2
  Filled 2021-06-15: qty 30, 30d supply, fill #3
  Filled 2021-07-20: qty 30, 30d supply, fill #4
  Filled 2021-08-20: qty 30, 30d supply, fill #5

## 2021-02-25 MED ORDER — CLONAZEPAM 0.5 MG PO TABS
ORAL_TABLET | ORAL | 0 refills | Status: AC
  Filled 2021-02-25: qty 30, 30d supply, fill #0

## 2021-04-15 ENCOUNTER — Other Ambulatory Visit (HOSPITAL_BASED_OUTPATIENT_CLINIC_OR_DEPARTMENT_OTHER): Payer: Self-pay

## 2021-05-16 ENCOUNTER — Other Ambulatory Visit (HOSPITAL_BASED_OUTPATIENT_CLINIC_OR_DEPARTMENT_OTHER): Payer: Self-pay

## 2021-06-16 ENCOUNTER — Other Ambulatory Visit (HOSPITAL_BASED_OUTPATIENT_CLINIC_OR_DEPARTMENT_OTHER): Payer: Self-pay

## 2021-07-21 ENCOUNTER — Other Ambulatory Visit (HOSPITAL_BASED_OUTPATIENT_CLINIC_OR_DEPARTMENT_OTHER): Payer: Self-pay

## 2021-08-21 ENCOUNTER — Other Ambulatory Visit (HOSPITAL_BASED_OUTPATIENT_CLINIC_OR_DEPARTMENT_OTHER): Payer: Self-pay

## 2021-09-10 ENCOUNTER — Other Ambulatory Visit (HOSPITAL_BASED_OUTPATIENT_CLINIC_OR_DEPARTMENT_OTHER): Payer: Self-pay

## 2021-09-10 MED ORDER — CITALOPRAM HYDROBROMIDE 20 MG PO TABS
ORAL_TABLET | ORAL | 0 refills | Status: DC
  Filled 2021-09-10: qty 135, 90d supply, fill #0

## 2021-11-21 IMAGING — DX DG FOOT COMPLETE 3+V*R*
3 series · 3 of 3 positions shown · non-contrast
Comparison: 12/21/2013.

CLINICAL DATA: Pain first MTP.

EXAM:
RIGHT FOOT COMPLETE - 3+ VIEW

[foot ap]
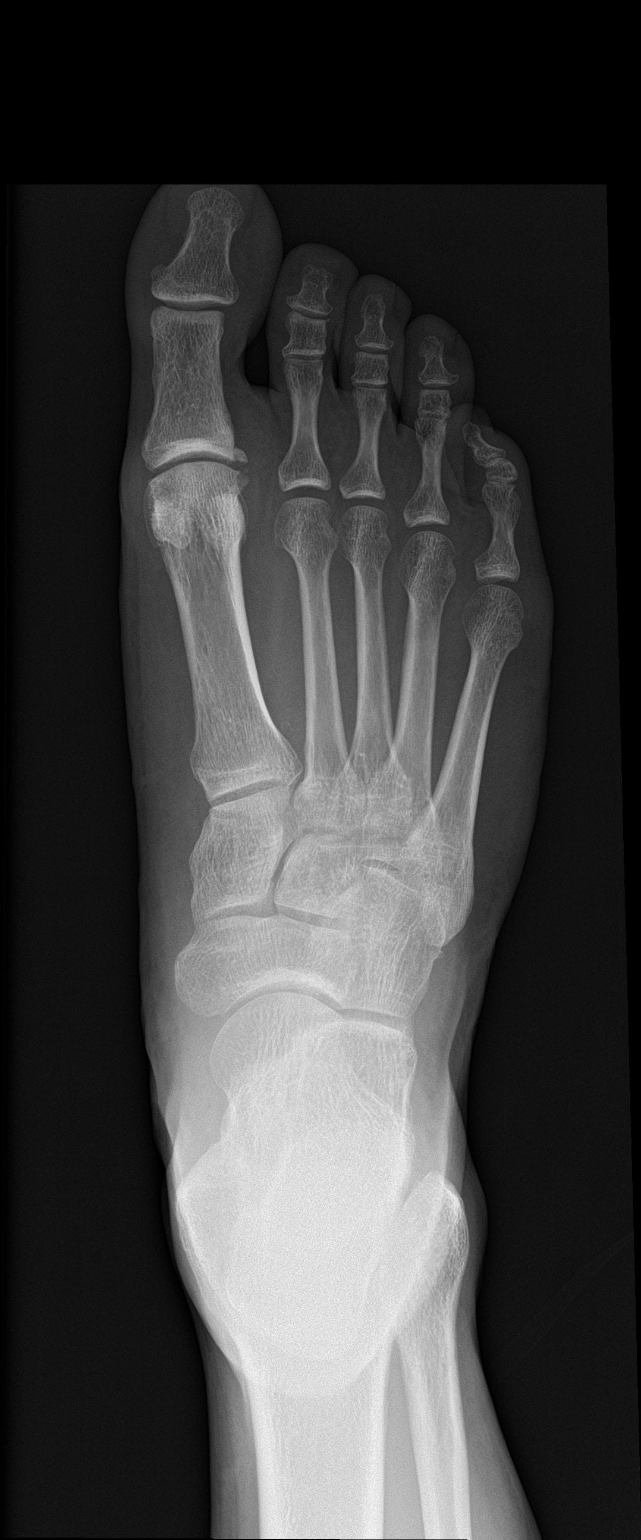

[foot obl]
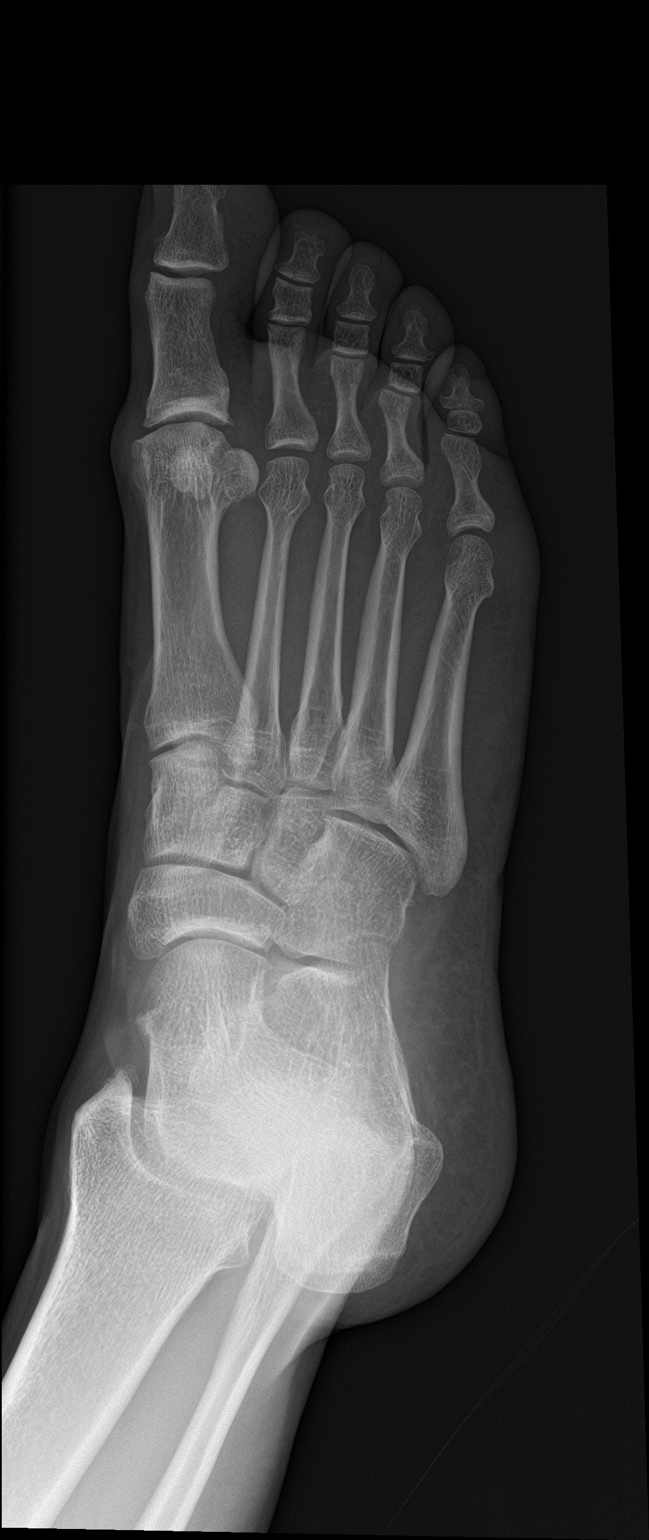

[foot lat]
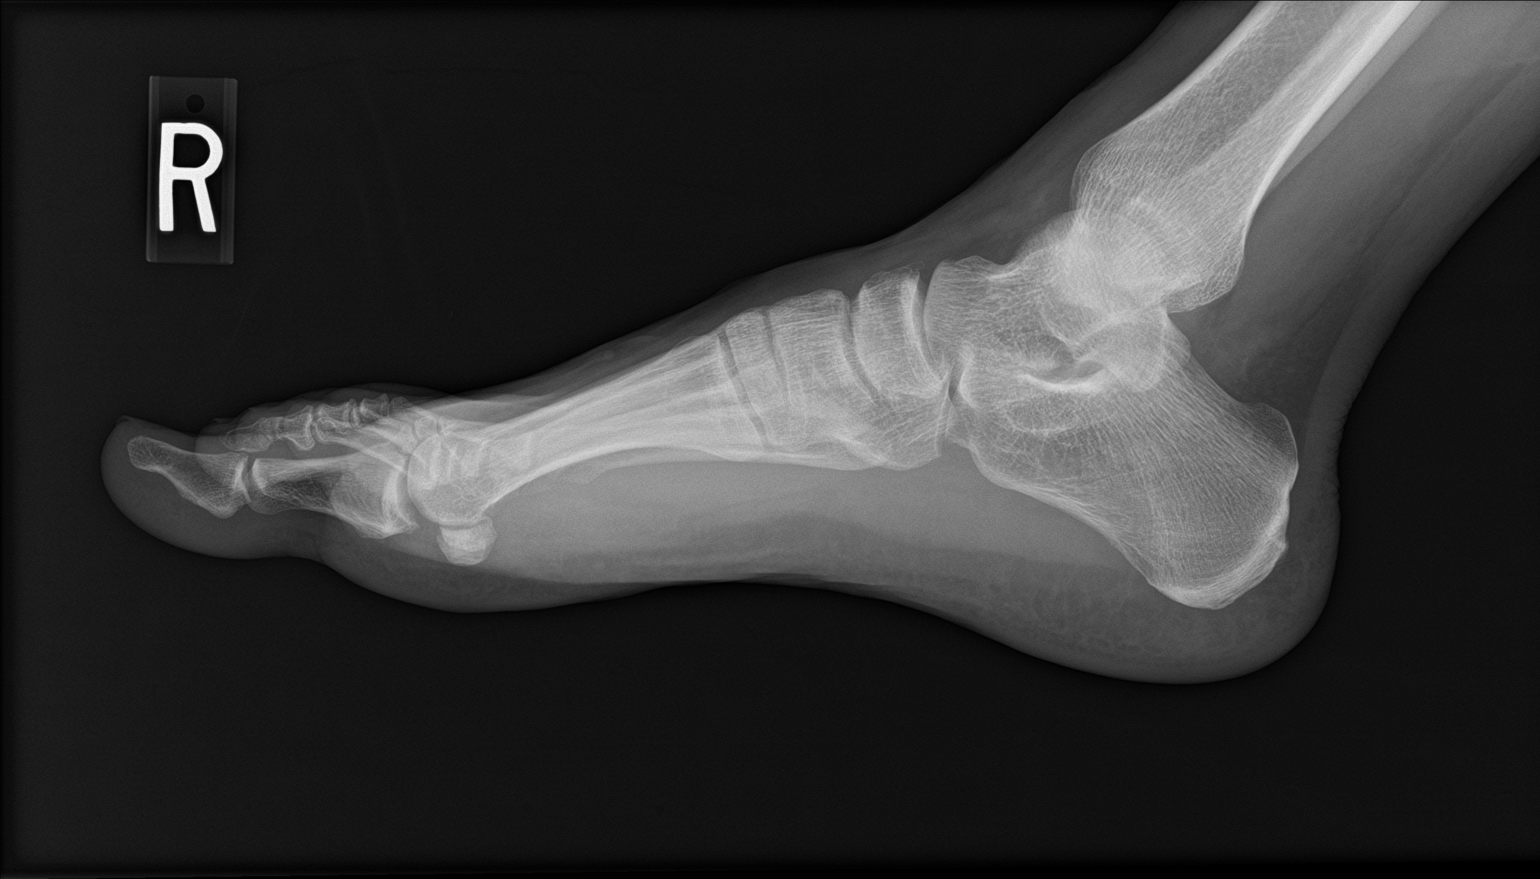

[3 of 3 positions shown; findings below may reference images not displayed]

FINDINGS: Prominent degenerative change first MTP, progressed from prior exam.
No evidence of erosive arthropathy. No acute bony or joint
abnormality identified. No evidence of fracture dislocation. No
radiopaque foreign body.
IMPRESSION: Prominent degenerative change first MTP, progressed from prior study
of 12/21/2013. No evidence of erosive arthropathy. No acute bony
abnormality identified.

## 2021-12-23 ENCOUNTER — Other Ambulatory Visit (HOSPITAL_BASED_OUTPATIENT_CLINIC_OR_DEPARTMENT_OTHER): Payer: Self-pay

## 2021-12-23 MED ORDER — CITALOPRAM HYDROBROMIDE 20 MG PO TABS
ORAL_TABLET | ORAL | 0 refills | Status: DC
  Filled 2021-12-23: qty 45, 30d supply, fill #0
  Filled 2022-02-23: qty 45, 30d supply, fill #1
  Filled 2022-03-22: qty 45, 30d supply, fill #2

## 2021-12-24 ENCOUNTER — Other Ambulatory Visit (HOSPITAL_BASED_OUTPATIENT_CLINIC_OR_DEPARTMENT_OTHER): Payer: Self-pay

## 2022-02-24 ENCOUNTER — Other Ambulatory Visit (HOSPITAL_BASED_OUTPATIENT_CLINIC_OR_DEPARTMENT_OTHER): Payer: Self-pay

## 2022-02-24 MED ORDER — ROSUVASTATIN CALCIUM 10 MG PO TABS
ORAL_TABLET | ORAL | 1 refills | Status: AC
  Filled 2022-02-24: qty 30, 30d supply, fill #0
  Filled 2022-03-22: qty 30, 30d supply, fill #1
  Filled 2022-04-26: qty 30, 30d supply, fill #2

## 2022-03-23 ENCOUNTER — Other Ambulatory Visit (HOSPITAL_BASED_OUTPATIENT_CLINIC_OR_DEPARTMENT_OTHER): Payer: Self-pay

## 2022-04-27 ENCOUNTER — Other Ambulatory Visit (HOSPITAL_BASED_OUTPATIENT_CLINIC_OR_DEPARTMENT_OTHER): Payer: Self-pay

## 2022-04-28 ENCOUNTER — Other Ambulatory Visit (HOSPITAL_BASED_OUTPATIENT_CLINIC_OR_DEPARTMENT_OTHER): Payer: Self-pay

## 2022-05-21 ENCOUNTER — Other Ambulatory Visit (HOSPITAL_BASED_OUTPATIENT_CLINIC_OR_DEPARTMENT_OTHER): Payer: Self-pay

## 2022-05-21 MED ORDER — CITALOPRAM HYDROBROMIDE 20 MG PO TABS
20.0000 mg | ORAL_TABLET | Freq: Every day | ORAL | 0 refills | Status: AC
  Filled 2022-05-21: qty 30, 30d supply, fill #0
  Filled 2022-06-18 (×2): qty 30, 30d supply, fill #1

## 2022-05-21 MED ORDER — ROSUVASTATIN CALCIUM 10 MG PO TABS
10.0000 mg | ORAL_TABLET | Freq: Every day | ORAL | 0 refills | Status: AC
  Filled 2022-05-21: qty 90, 90d supply, fill #0

## 2022-06-18 ENCOUNTER — Other Ambulatory Visit (HOSPITAL_BASED_OUTPATIENT_CLINIC_OR_DEPARTMENT_OTHER): Payer: Self-pay

## 2022-08-26 ENCOUNTER — Other Ambulatory Visit (HOSPITAL_BASED_OUTPATIENT_CLINIC_OR_DEPARTMENT_OTHER): Payer: Self-pay

## 2022-08-26 MED ORDER — ROSUVASTATIN CALCIUM 10 MG PO TABS
10.0000 mg | ORAL_TABLET | Freq: Every day | ORAL | 0 refills | Status: DC
  Filled 2022-08-26: qty 90, 90d supply, fill #0

## 2023-01-06 ENCOUNTER — Other Ambulatory Visit: Payer: Self-pay | Admitting: Family Medicine

## 2023-01-06 ENCOUNTER — Ambulatory Visit
Admission: RE | Admit: 2023-01-06 | Discharge: 2023-01-06 | Disposition: A | Discharge status: Home / Self Care | Payer: 59 | Source: Ambulatory Visit | Attending: Family Medicine | Admitting: Family Medicine

## 2023-01-06 DIAGNOSIS — R6889 Other general symptoms and signs: Secondary | ICD-10-CM

## 2023-01-12 ENCOUNTER — Other Ambulatory Visit: Payer: Self-pay | Admitting: Family Medicine

## 2023-01-12 DIAGNOSIS — I6521 Occlusion and stenosis of right carotid artery: Secondary | ICD-10-CM

## 2023-01-13 ENCOUNTER — Other Ambulatory Visit (HOSPITAL_BASED_OUTPATIENT_CLINIC_OR_DEPARTMENT_OTHER): Payer: Self-pay

## 2023-01-21 ENCOUNTER — Ambulatory Visit
Admission: RE | Admit: 2023-01-21 | Discharge: 2023-01-21 | Disposition: A | Discharge status: Home / Self Care | Payer: 59 | Source: Ambulatory Visit | Attending: Family Medicine | Admitting: Family Medicine

## 2023-01-21 ENCOUNTER — Other Ambulatory Visit: Payer: 59

## 2023-01-21 DIAGNOSIS — I6521 Occlusion and stenosis of right carotid artery: Secondary | ICD-10-CM
